# Patient Record
Sex: Female | Born: 1990 | Race: Black or African American | Hispanic: No | Marital: Single | Smoking: Never smoker
Health system: Southern US, Community
[De-identification: ages and names within clinical notes are randomized; demographics above are authoritative.]

---

## 2017-06-14 ENCOUNTER — Emergency Department (HOSPITAL_COMMUNITY): Payer: 59

## 2017-06-14 ENCOUNTER — Encounter (HOSPITAL_COMMUNITY): Payer: Self-pay | Admitting: Emergency Medicine

## 2017-06-14 ENCOUNTER — Emergency Department (HOSPITAL_COMMUNITY)
Admission: EM | Admit: 2017-06-14 | Discharge: 2017-06-14 | Disposition: A | Discharge status: Home / Self Care | Payer: 59 | Attending: Emergency Medicine | Admitting: Emergency Medicine

## 2017-06-14 DIAGNOSIS — Y939 Activity, unspecified: Secondary | ICD-10-CM | POA: Insufficient documentation

## 2017-06-14 DIAGNOSIS — Y92410 Unspecified street and highway as the place of occurrence of the external cause: Secondary | ICD-10-CM | POA: Diagnosis not present

## 2017-06-14 DIAGNOSIS — R51 Headache: Secondary | ICD-10-CM | POA: Insufficient documentation

## 2017-06-14 DIAGNOSIS — Y998 Other external cause status: Secondary | ICD-10-CM | POA: Diagnosis not present

## 2017-06-14 DIAGNOSIS — M545 Low back pain: Secondary | ICD-10-CM | POA: Diagnosis not present

## 2017-06-14 LAB — POC URINE PREG, ED: Preg Test, Ur: NEGATIVE

## 2017-06-14 MED ORDER — ACETAMINOPHEN 500 MG PO TABS: 500.0000 mg | ORAL_TABLET | Freq: Four times a day (QID) | ORAL | 0 refills | Status: AC | PRN

## 2017-06-14 MED ORDER — CYCLOBENZAPRINE HCL 10 MG PO TABS: 10.0000 mg | ORAL_TABLET | Freq: Two times a day (BID) | ORAL | 0 refills | Status: AC | PRN

## 2017-06-14 MED ORDER — IBUPROFEN 600 MG PO TABS: 600.0000 mg | ORAL_TABLET | Freq: Four times a day (QID) | ORAL | 0 refills | Status: AC | PRN
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
## 2017-06-14 NOTE — ED Notes (Signed)
Warm blankets given.
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
